# Patient Record
Sex: Male | Born: 1990 | Race: Black or African American | Hispanic: No | Marital: Single | Smoking: Never smoker
Health system: Southern US, Community
[De-identification: ages and names within clinical notes are randomized; demographics above are authoritative.]

---
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
## 2016-09-08 ENCOUNTER — Emergency Department (HOSPITAL_COMMUNITY): Payer: Self-pay

## 2016-09-08 ENCOUNTER — Emergency Department (HOSPITAL_COMMUNITY)
Admission: EM | Admit: 2016-09-08 | Discharge: 2016-09-08 | Disposition: A | Discharge status: Home / Self Care | Payer: Self-pay | Attending: Emergency Medicine | Admitting: Emergency Medicine

## 2016-09-08 ENCOUNTER — Encounter (HOSPITAL_COMMUNITY): Payer: Self-pay | Admitting: *Deleted

## 2016-09-08 DIAGNOSIS — R51 Headache: Secondary | ICD-10-CM | POA: Insufficient documentation

## 2016-09-08 DIAGNOSIS — R519 Headache, unspecified: Secondary | ICD-10-CM

## 2016-09-08 MED ORDER — KETOROLAC TROMETHAMINE 30 MG/ML IJ SOLN
30.0000 mg | Freq: Once | INTRAMUSCULAR | Status: AC
  Administered 2016-09-08: 30 mg via INTRAMUSCULAR
  Filled 2016-09-08: qty 1

## 2016-09-08 MED ORDER — METOCLOPRAMIDE HCL 10 MG PO TABS
5.0000 mg | ORAL_TABLET | Freq: Once | ORAL | Status: AC
  Administered 2016-09-08: 5 mg via ORAL
  Filled 2016-09-08: qty 1

## 2016-09-08 MED ORDER — DIPHENHYDRAMINE HCL 25 MG PO CAPS
25.0000 mg | ORAL_CAPSULE | Freq: Once | ORAL | Status: AC
  Administered 2016-09-08: 25 mg via ORAL
  Filled 2016-09-08: qty 1

## 2016-09-08 NOTE — ED Provider Notes (Signed)
[facility]-EMERGENCY DEPT Provider Note   CSN: [id number] Arrival date & time: 09/08/16  1254     History   Chief Complaint Chief Complaint  Patient presents with  . Headache    HPI Desman Betsey HolidayMathis is a 26 y.o. male.  HPI    26 year old male presents to his complaint of headache. Patient reports symptoms started 3 days ago. He reports pain and his eyes radiating to the back of his neck. He notes similar symptoms several years ago that required medications in the emergency room that resolved it. Patient notes that he took Excedrin yesterday, notes that this improved his headache, but it came back again. Patient denies any neurological deficits. He notes generalized neck and back aches. No recent infectious etiology, insect bites.   History reviewed. No pertinent past medical history.  There are no active problems to display for this patient.   History reviewed. No pertinent surgical history.     Home Medications    Prior to Admission medications   Not on File    Family History No family history on file.  Social History Social History  Substance Use Topics  . Smoking status: Never Smoker  . Smokeless tobacco: Never Used  . Alcohol use Not on file     Allergies   Patient has no known allergies.   Review of Systems Review of Systems  All other systems reviewed and are negative.  Physical Exam Updated Vital Signs BP 98/74 (BP Location: Right Arm)   Pulse 89   Temp 98.3 F (36.8 C) (Oral)   Resp 17   Ht 6' (1.829 m)   Wt 65.8 kg (145 lb)   SpO2 100%   BMI 19.67 kg/m   Physical Exam  Constitutional: He is oriented to person, place, and time. He appears well-developed and well-nourished.  HENT:  Head: Normocephalic and atraumatic.  Eyes: Pupils are equal, round, and reactive to light. Conjunctivae are normal. Right eye exhibits no discharge. Left eye exhibits no discharge. No scleral icterus.  Neck: Normal range of motion. No JVD present. No tracheal  deviation present.  Pulmonary/Chest: Effort normal. No stridor.  Neurological: He is alert and oriented to person, place, and time. He has normal strength. No cranial nerve deficit or sensory deficit. Coordination normal. GCS eye subscore is 4. GCS verbal subscore is 5. GCS motor subscore is 6.  No meningeal signs. No active range of motion of the neck  Psychiatric: He has a normal mood and affect. His behavior is normal. Judgment and thought content normal.  Nursing note and vitals reviewed.    ED Treatments / Results  Labs (all labs ordered are listed, but only abnormal results are displayed) Labs Reviewed - No data to display  EKG  EKG Interpretation None       Radiology Ct Head Wo Contrast  Result Date: 09/08/2016 CLINICAL DATA:  Headache with nausea and emesis for 2 days EXAM: CT HEAD WITHOUT CONTRAST TECHNIQUE: Contiguous axial images were obtained from the base of the skull through the vertex without intravenous contrast. COMPARISON:  None. FINDINGS: Brain: No evidence of acute infarction, hemorrhage, hydrocephalus, extra-axial collection or mass lesion/mass effect. Vascular: No hyperdense vessel or unexpected calcification. Skull: Normal. Negative for fracture or focal lesion. Sinuses/Orbits: Mild mucosal thickening in the maxillary and ethmoid sinuses. No acute orbital abnormality. Other: None IMPRESSION: No CT evidence for acute intracranial abnormality. Electronically Signed   By: Jasmine PangKim  Fujinaga M.D.   On: 09/08/2016 17:56    Procedures Procedures (including critical  care time)  Medications Ordered in ED Medications  ketorolac (TORADOL) 30 MG/ML injection 30 mg (30 mg Intramuscular Given 09/08/16 1519)  metoCLOPramide (REGLAN) tablet 5 mg (5 mg Oral Given 09/08/16 1520)  diphenhydrAMINE (BENADRYL) capsule 25 mg (25 mg Oral Given 09/08/16 1519)     Initial Impression / Assessment and Plan / ED Course  I have reviewed the triage vital signs and the nursing notes.  Pertinent  labs & imaging results that were available during my care of the patient were reviewed by me and considered in my medical decision making (see chart for details).     Final Clinical Impressions(s) / ED Diagnoses   Final diagnoses:  Acute nonintractable headache, unspecified headache type    Labs:   Imaging:CT head without contrast  Consults:  Therapeutics: Toradol, Reglan, Benadryl  Discharge Meds:   Assessment/Plan:   26 year old male presents today with headache. Significantly improved with the above medications. CT scan showing no acute findings. No signs of meningitis or any other significant life-threatening etiology on this patient. He is discharged home with symptomatic care instructions and strict return precautions. He verbalized understanding and agreement today's plan had no further questions or concerns at time of discharge.   New Prescriptions There are no discharge medications for this patient.    Eyvonne Mechanic, PA-C 09/08/16 2054    Lavera Guise, MD 09/09/16 [phone]

## 2016-09-08 NOTE — ED Triage Notes (Signed)
Pt states that he has had a headache for 2 days. Pt reports feeling chilled and nausea as well.

## 2016-09-08 NOTE — Discharge Instructions (Signed)
Please read attached information. If you experience any new or worsening signs or symptoms please return to the emergency room for evaluation. Please follow-up with your primary care provider or specialist as discussed. Please use medication prescribed only as directed and discontinue taking if you have any concerning signs or symptoms.   °

## 2016-09-08 NOTE — ED Notes (Signed)
Declined W/C at D/C and was escorted to lobby by RN. 

## 2018-10-30 ENCOUNTER — Emergency Department (HOSPITAL_COMMUNITY)
Admission: EM | Admit: 2018-10-30 | Discharge: 2018-10-30 | Disposition: A | Discharge status: Home / Self Care | Payer: Self-pay | Attending: Emergency Medicine | Admitting: Emergency Medicine

## 2018-10-30 ENCOUNTER — Other Ambulatory Visit: Payer: Self-pay

## 2018-10-30 ENCOUNTER — Encounter (HOSPITAL_COMMUNITY): Payer: Self-pay | Admitting: Emergency Medicine

## 2018-10-30 DIAGNOSIS — R519 Headache, unspecified: Secondary | ICD-10-CM | POA: Insufficient documentation

## 2018-10-30 DIAGNOSIS — H53149 Visual discomfort, unspecified: Secondary | ICD-10-CM | POA: Insufficient documentation

## 2018-10-30 DIAGNOSIS — R1110 Vomiting, unspecified: Secondary | ICD-10-CM | POA: Insufficient documentation

## 2018-10-30 MED ORDER — METOCLOPRAMIDE HCL 10 MG PO TABS
10.0000 mg | ORAL_TABLET | Freq: Once | ORAL | Status: AC
  Administered 2018-10-30: 10 mg via ORAL
  Filled 2018-10-30: qty 1

## 2018-10-30 MED ORDER — KETOROLAC TROMETHAMINE 30 MG/ML IJ SOLN
30.0000 mg | Freq: Once | INTRAMUSCULAR | Status: AC
  Administered 2018-10-30: 15:00:00 30 mg via INTRAMUSCULAR
  Filled 2018-10-30: qty 1

## 2018-10-30 MED ORDER — DIPHENHYDRAMINE HCL 25 MG PO CAPS
25.0000 mg | ORAL_CAPSULE | Freq: Once | ORAL | Status: AC
  Administered 2018-10-30: 15:00:00 25 mg via ORAL
  Filled 2018-10-30: qty 1

## 2018-10-30 NOTE — ED Provider Notes (Signed)
[facility]-EMERGENCY DEPT Provider Note   CSN: [id number] Arrival date & time: 10/30/18  1119     History   Chief Complaint Chief Complaint  Patient presents with  . Headache    HPI Kenneth Small is a 28 y.o. male.     28 y.o male with a PMH of headaches presents to the ED with a chief complaint of a headache x2 days.  Patient describes a dullness sensation to the top of his head with radiation throughout.  He does report similar episodes in the past of headaches which have been usually been resolved with medication in the ED.  He reports his headache is somewhat alleviated with Excedrin however returned today.  He does report one episode of nonbilious, nonbloody emesis prior to arrival which she believes he likely vomited his Excedrin.  He reports not being diagnosed with any prior history of migraines.  Does have some photophobia.  Denies any phonophobia, neck pain, no changes in vision or numbness.   The history is provided by the patient.     Home Medications    Prior to Admission medications   Not on File    Family History No family history on file.  Social History Social History   Tobacco Use  . Smoking status: Never Smoker  . Smokeless tobacco: Never Used  Substance Use Topics  . Alcohol use: Not on file  . Drug use: Not on file     Allergies   Patient has no known allergies.   Review of Systems Review of Systems  Constitutional: Negative for chills and fever.  HENT: Negative for ear pain and sore throat.   Eyes: Negative for pain and visual disturbance.  Respiratory: Negative for cough and shortness of breath.   Cardiovascular: Negative for chest pain and palpitations.  Gastrointestinal: Positive for vomiting. Negative for abdominal pain and nausea.  Genitourinary: Negative for dysuria and hematuria.  Musculoskeletal: Negative for arthralgias and back pain.  Skin: Negative for color change and rash.  Neurological: Positive  for headaches. Negative for dizziness, seizures, syncope, weakness, light-headedness and numbness.  All other systems reviewed and are negative.    Physical Exam Updated Vital Signs BP 122/60 (BP Location: Right Arm)   Pulse 83   Temp 98.2 F (36.8 C) (Oral)   Resp 16   SpO2 99%   Physical Exam Vitals signs and nursing note reviewed.  Constitutional:      Appearance: He is well-developed.  HENT:     Head: Normocephalic and atraumatic.  Eyes:     General: No scleral icterus.    Pupils: Pupils are equal, round, and reactive to light.  Neck:     Musculoskeletal: Normal range of motion.  Cardiovascular:     Heart sounds: Normal heart sounds.  Pulmonary:     Effort: Pulmonary effort is normal.     Breath sounds: Normal breath sounds. No wheezing.  Chest:     Chest wall: No tenderness.  Abdominal:     General: Bowel sounds are normal. There is no distension.     Palpations: Abdomen is soft.     Tenderness: There is no abdominal tenderness.  Musculoskeletal:        General: No tenderness or deformity.  Skin:    General: Skin is warm and dry.  Neurological:     Mental Status: He is alert and oriented to person, place, and time.     Comments: Alert, oriented, thought content appropriate. Speech fluent without evidence of  aphasia. Able to follow 2 step commands without difficulty.  Cranial Nerves:  II:  Peripheral visual fields grossly normal, pupils, round, reactive to light III,IV, VI: ptosis not present, extra-ocular motions intact bilaterally  V,VII: smile symmetric, facial light touch sensation equal VIII: hearing grossly normal bilaterally  IX,X: midline uvula rise  XI: bilateral shoulder shrug equal and strong XII: midline tongue extension  Motor:  5/5 in upper and lower extremities bilaterally including strong and equal grip strength and dorsiflexion/plantar flexion Sensory: light touch normal in all extremities.  Cerebellar: normal finger-to-nose with bilateral  upper extremities, pronator drift negative Gait: normal gait and balance       ED Treatments / Results  Labs (all labs ordered are listed, but only abnormal results are displayed) Labs Reviewed - No data to display  EKG None  Radiology No results found.  Procedures Procedures (including critical care time)  Medications Ordered in ED Medications  metoCLOPramide (REGLAN) tablet 10 mg (10 mg Oral Given 10/30/18 1434)  diphenhydrAMINE (BENADRYL) capsule 25 mg (25 mg Oral Given 10/30/18 1433)  ketorolac (TORADOL) 30 MG/ML injection 30 mg (30 mg Intramuscular Given 10/30/18 1434)     Initial Impression / Assessment and Plan / ED Course  I have reviewed the triage vital signs and the nursing notes.  Pertinent labs & imaging results that were available during my care of the patient were reviewed by me and considered in my medical decision making (see chart for details).       Patient with a past medical history of headaches sent to the ED with complaint of headache for the past 4 days.  Reports this is similar in nature to his previous episodes.  He denies any trauma, changes in vision, weakness.  Did have one episode of vomiting prior to arrival.  Has been taking Excedrin with some improvement in symptoms however headache seems to return.  He does report alcohol use, last use on Sunday.  Patient has not been placed on any prophylactic medicine for migraines or diagnosed with migraine via physician.  During my evaluation patient appears uncomfortable, there is photophobia on my exam.  Pupils are equal and reactive.  Neuro exam is unremarkable.  Patient is ambulatory with a steady gait wearing his sunglasses.  We will provide him with pain medication to further manage his symptoms.  According to his records which have been personally reviewed, a CT imaging was performed 2 years ago which did not show any aneurysms, acute pathology then.  Seeing as patient reports is a similar episode we  will attempt medication prior to reassessment and disposition.   4:37 PM patient was reassessed by me.  Vital signs are within normal limits.  He is eating and drinking appropriately.  Reports his headache has resolved after headache cocktail.  No further emesis episodes.  Otherwise is well-appearing.  We will provide him with referral for neurology to help further manage his bad headaches.  Patient understands and agrees with management, return precautions provided at length.   Portions of this note were generated with Lobbyist. Dictation errors may occur despite best attempts at proofreading.  Final Clinical Impressions(s) / ED Diagnoses   Final diagnoses:  Bad headache    ED Discharge Orders    None       Janeece Fitting, PA-C 10/30/18 1638    Lucrezia Starch, MD 10/31/18 1015

## 2018-10-30 NOTE — Discharge Instructions (Addendum)
I have provided a referral to neurology. Please schedule an appointment to obtain further management of your recurrent headaches.   If you experience any dizziness, blurry vision or worsening symptoms return to the ED.

## 2018-10-30 NOTE — ED Triage Notes (Signed)
Pt having constant headache since Sunday. Reports will have these yearly. Taken Excedrin without relief.

## 2019-01-24 ENCOUNTER — Ambulatory Visit (HOSPITAL_COMMUNITY)
Admission: EM | Admit: 2019-01-24 | Discharge: 2019-01-24 | Disposition: A | Discharge status: Home / Self Care | Payer: Self-pay | Attending: Family Medicine | Admitting: Family Medicine

## 2019-01-24 ENCOUNTER — Other Ambulatory Visit: Payer: Self-pay

## 2019-01-24 ENCOUNTER — Encounter (HOSPITAL_COMMUNITY): Payer: Self-pay

## 2019-01-24 DIAGNOSIS — L42 Pityriasis rosea: Secondary | ICD-10-CM

## 2019-01-24 NOTE — ED Triage Notes (Signed)
Pt is here with a rash that developed after he changed soap(s), the rash is on his all over his body started a week ago.

## 2019-01-24 NOTE — ED Provider Notes (Signed)
[facility]   [id number] 01/24/19 Arrival Time: 1416  ASSESSMENT & PLAN:  1. Pityriasis rosea     See AVS for d/c information. No signs of allergic reaction. May f/u here as needed.  Reviewed expectations re: course of current medical issues. Questions answered. Outlined signs and symptoms indicating need for more acute intervention. Patient verbalized understanding. After Visit Summary given.   SUBJECTIVE:  Kenneth Small is a 29 y.o. male who presents with a skin complaint.   Location: torso/back Onset: gradual Duration: one week Associated pruritis? minimal Associated pain? none Progression: increasing steadily  Drainage? none Known trigger? No  New soaps/lotions/topicals/detergents? Questions relation to new soap. Environmental exposures? No  Contacts with similar? No  Recent travel? No  Other associated symptoms: none Therapies tried thus far: none Arthralgia or myalgia? none Recent illness? none Fever? none New medications? none No specific aggravating or alleviating factors reported.  ROS: As per HPI.  OBJECTIVE: Vitals:   01/24/19 1507 01/24/19 1511  BP:  109/69  Pulse:  65  Resp:  16  Temp:  98.5 F (36.9 C)  TempSrc:  Oral  SpO2:  100%  Weight: 69.7 kg     General appearance: alert; no distress HEENT: [place]; AT Neck: supple with FROM Lungs: clear to auscultation bilaterally Heart: regular rate and rhythm Extremities: no edema; moves all extremities normally Skin: warm and dry; crops of oval, sharply delimited papules on trunk oriented along skin lines; none on the scalp and face. Psychological: alert and cooperative; normal mood and affect  No Known Allergies  History reviewed. No pertinent past medical history. Social History   Socioeconomic History  . Marital status: Single    Spouse name: Not on file  . Number of children: Not on file  . Years of education: Not on file  . Highest education level: Not on file    Occupational History  . Not on file  Tobacco Use  . Smoking status: Never Smoker  . Smokeless tobacco: Never Used  Substance and Sexual Activity  . Alcohol use: Yes  . Drug use: Yes    Types: Marijuana  . Sexual activity: Yes    Birth control/protection: None  Other Topics Concern  . Not on file  Social History Narrative  . Not on file   Social Determinants of Health   Financial Resource Strain:   . Difficulty of Paying Living Expenses: Not on file  Food Insecurity:   . Worried About Programme researcher, broadcasting/film/video in the Last Year: Not on file  . Ran Out of Food in the Last Year: Not on file  Transportation Needs:   . Lack of Transportation (Medical): Not on file  . Lack of Transportation (Non-Medical): Not on file  Physical Activity:   . Days of Exercise per Week: Not on file  . Minutes of Exercise per Session: Not on file  Stress:   . Feeling of Stress : Not on file  Social Connections:   . Frequency of Communication with Friends and Family: Not on file  . Frequency of Social Gatherings with Friends and Family: Not on file  . Attends Religious Services: Not on file  . Active Member of Clubs or Organizations: Not on file  . Attends Banker Meetings: Not on file  . Marital Status: Not on file  Intimate Partner Violence:   . Fear of Current or Ex-Partner: Not on file  . Emotionally Abused: Not on file  . Physically Abused: Not on file  .  Sexually Abused: Not on file   Family History  Problem Relation Age of Onset  . Thyroid disease Mother   . Healthy Father    History reviewed. No pertinent surgical history.   Vanessa Kick, MD 01/24/19 [phone]

## 2020-12-18 ENCOUNTER — Emergency Department (HOSPITAL_COMMUNITY): Payer: Self-pay

## 2020-12-18 ENCOUNTER — Emergency Department (HOSPITAL_COMMUNITY)
Admission: EM | Admit: 2020-12-18 | Discharge: 2020-12-18 | Disposition: A | Discharge status: Home / Self Care | Payer: Self-pay | Attending: Emergency Medicine | Admitting: Emergency Medicine

## 2020-12-18 ENCOUNTER — Encounter (HOSPITAL_COMMUNITY): Payer: Self-pay | Admitting: *Deleted

## 2020-12-18 DIAGNOSIS — M5116 Intervertebral disc disorders with radiculopathy, lumbar region: Secondary | ICD-10-CM | POA: Insufficient documentation

## 2020-12-18 DIAGNOSIS — M5126 Other intervertebral disc displacement, lumbar region: Secondary | ICD-10-CM

## 2020-12-18 DIAGNOSIS — M5416 Radiculopathy, lumbar region: Secondary | ICD-10-CM

## 2020-12-18 MED ORDER — HYDROCODONE-ACETAMINOPHEN 5-325 MG PO TABS: 1.0000 | ORAL_TABLET | Freq: Four times a day (QID) | ORAL | 0 refills | Status: AC | PRN

## 2020-12-18 MED ORDER — PREDNISONE 10 MG (21) PO TBPK: ORAL_TABLET | Freq: Every day | ORAL | 0 refills | Status: DC

## 2020-12-18 MED ORDER — PREDNISONE 50 MG PO TABS: 60.0000 mg | ORAL_TABLET | Freq: Once | ORAL | Status: DC

## 2020-12-18 NOTE — Discharge Instructions (Signed)
Take the entire course of the prednisone prescribed.  You may also take the hydrocodone if needed for increased pain, do not drive within 4 hours of taking this medication however as it will make you drowsy.  You may use a heating pad applied to your lower back for 20 minutes several times daily.  Call Dr. Danielle Dess as discussed for follow-up care.  You have also been referred for physical therapy which may help you with your low back pain.  Avoid any activity that makes your symptoms worse.  You have also been given some work restrictions as discussed, refer to the work note attached.

## 2020-12-18 NOTE — ED Triage Notes (Signed)
Pain in back radiating into right thigh, injured 2 months ago at a bowling tournament

## 2020-12-18 NOTE — ED Provider Notes (Signed)
[facility] EMERGENCY DEPARTMENT Provider Note   CSN: [id number] Arrival date & time: 12/18/20  1039     History Chief Complaint  Patient presents with   Back Pain    Kenneth Small is a 30 y.o. male presenting for evaluation of low back pain which radiates into his right thigh and intermittently to his calf and is associated with intermittent pins and needle sensation in the right foot.  He describes a low back injury when he was bowling about 2 months ago, he used rest at which time his pain completely resolved.  However over the past 3 weeks he has had a return of pain which started in his lower back and has now migrated and localizes in his right posterior buttock region.  He is working for [employer] as a Librarian, academic person and states he has been able to tolerate his work days, although with increasing pain as described.  He gets pins-and-needles sensation in the right foot which seems to be triggered by sitting, especially at the end of his workday.  He denies urinary incontinence or retention but has noticed an urgency to his need to empty his bladder.  He denies painful urination.  He has had no fevers or chills, he denies numbness in the perirectal or perineal region.  He has taken Bayer back and body and is also tried Aleve which does give temporary relief of his symptoms.  The history is provided by the patient.      History reviewed. No pertinent past medical history.  There are no problems to display for this patient.   History reviewed. No pertinent surgical history.     Family History  Problem Relation Age of Onset   Thyroid disease Mother    Healthy Father     Social History   Tobacco Use   Smoking status: Never   Smokeless tobacco: Never  Substance Use Topics   Alcohol use: Yes   Drug use: Yes    Types: Marijuana    Home Medications Prior to Admission medications   Medication Sig Start Date End Date Taking? Authorizing Provider   HYDROcodone-acetaminophen (NORCO/VICODIN) 5-325 MG tablet Take 1 tablet by mouth every 6 (six) hours as needed for severe pain. 12/18/20  Yes Deklynn Charlet, Raynelle Fanning, PA-C  predniSONE (STERAPRED UNI-PAK 21 TAB) 10 MG (21) TBPK tablet Take by mouth daily. Take 6 tabs by mouth daily  for 2 days, then 5 tabs for 2 days, then 4 tabs for 2 days, then 3 tabs for 2 days, 2 tabs for 2 days, then 1 tab by mouth daily for 2 days 12/18/20  Yes Lenell Mcconnell, Raynelle Fanning, PA-C    Allergies    Patient has no known allergies.  Review of Systems   Review of Systems  Constitutional:  Negative for fever.  Respiratory:  Negative for shortness of breath.   Cardiovascular:  Negative for chest pain and leg swelling.  Gastrointestinal:  Negative for abdominal distention, abdominal pain and constipation.  Genitourinary:  Negative for difficulty urinating, dysuria, flank pain, frequency and urgency.  Musculoskeletal:  Positive for back pain. Negative for gait problem and joint swelling.  Skin:  Negative for rash.  Neurological:  Negative for weakness and numbness.  All other systems reviewed and are negative.  Physical Exam Updated Vital Signs BP 103/74 (BP Location: Right Arm)    Pulse 60    Temp 98 F (36.7 C) (Oral)    Resp 16    SpO2 100%   Physical Exam Vitals and  nursing note reviewed.  Constitutional:      Appearance: He is well-developed.  HENT:     Head: Normocephalic.  Eyes:     Conjunctiva/sclera: Conjunctivae normal.  Cardiovascular:     Rate and Rhythm: Normal rate.     Pulses: Normal pulses.     Comments: Pedal pulses normal. Pulmonary:     Effort: Pulmonary effort is normal.  Abdominal:     General: Bowel sounds are normal. There is no distension.     Palpations: Abdomen is soft. There is no mass.  Musculoskeletal:        General: Normal range of motion.     Cervical back: Normal range of motion and neck supple.     Lumbar back: Tenderness present. No swelling, edema or spasms.       Legs:     Comments:  No midline lumbar sacral pain or deformity.  He does have some tenderness in his right sciatic notch region and his right lateral hip.  Positive straight leg raise left.  Skin:    General: Skin is warm and dry.  Neurological:     General: No focal deficit present.     Mental Status: He is alert.     Sensory: No sensory deficit.     Motor: No tremor or atrophy.     Gait: Gait normal.     Deep Tendon Reflexes:     Reflex Scores:      Patellar reflexes are 2+ on the right side and 2+ on the left side.    Comments: No strength deficit noted in hip and knee flexor and extensor muscle groups.  Ankle flexion and extension intact.    ED Results / Procedures / Treatments   Labs (all labs ordered are listed, but only abnormal results are displayed) Labs Reviewed - No data to display  EKG None  Radiology MR LUMBAR SPINE WO CONTRAST  Result Date: 12/18/2020 CLINICAL DATA:  Low back pain radiating to right thigh EXAM: MRI LUMBAR SPINE WITHOUT CONTRAST TECHNIQUE: Multiplanar, multisequence MR imaging of the lumbar spine was performed. No intravenous contrast was administered. COMPARISON:  None. FINDINGS: Segmentation: Standard; the lowest formed disc space is designated L5-S1 Alignment:  Normal. Vertebrae: Vertebral body heights are preserved. Marrow signal is normal. Conus medullaris and cauda equina: Conus extends to the L1-L2 level. Conus and cauda equina appear normal. Paraspinal and other soft tissues: Unremarkable. Disc levels: There is disc desiccation and narrowing at L5-S1. There is a prominent right subarticular zone protrusion which effaces the subarticular zone and displaces and likely impinges the traversing right S1 nerve root. There is no significant spinal canal or neural foraminal stenosis at this level. The disc spaces are otherwise preserved. There is no significant spinal canal or neural foraminal stenosis at the other levels. IMPRESSION: Right subarticular zone disc protrusion at  L5-S1 which displaces and likely impinges the traversing S1 nerve root, likely accounting for the patient's symptoms. Electronically Signed   By: Lesia Hausen M.D.   On: 12/18/2020 13:04    Procedures Procedures   Medications Ordered in ED Medications - No data to display  ED Course  I have reviewed the triage vital signs and the nursing notes.  Pertinent labs & imaging results that were available during my care of the patient were reviewed by me and considered in my medical decision making (see chart for details).    MDM Rules/Calculators/A&P  Patient's MRI reviewed and discussed with patient.  Also discussed with Dr. Danielle Dess of neurosurgery who recommends a 2-week prednisone taper which was started.  We also discussed other home treatments including activity as tolerated, heat therapy, avoiding lifting greater than 15 pounds and bending and squatting more than a third of his workday.  Dr. Danielle Dess has also suggested outpatient physical therapy while he is waiting for follow-up with him, he is to call for an appointment within the next 2 weeks.  Patient is agreeable to this plan.  Is also given a few hydrocodone tablets, caution regarding sedation.  Return precautions were also outlined.    Final Clinical Impression(s) / ED Diagnoses Final diagnoses:  Herniated lumbar intervertebral disc  Lumbar radiculopathy    Rx / DC Orders ED Discharge Orders          Ordered    predniSONE (STERAPRED UNI-PAK 21 TAB) 10 MG (21) TBPK tablet  Daily        12/18/20 1615    HYDROcodone-acetaminophen (NORCO/VICODIN) 5-325 MG tablet  Every 6 hours PRN        12/18/20 1615    PT eval and treat        12/18/20 1623             Burgess Amor, PA-C 12/18/20 1713    Vanetta Mulders, MD 12/23/20 1315

## 2021-01-03 ENCOUNTER — Emergency Department (HOSPITAL_COMMUNITY)
Admission: EM | Admit: 2021-01-03 | Discharge: 2021-01-03 | Disposition: A | Discharge status: Home / Self Care | Payer: Self-pay | Attending: Emergency Medicine | Admitting: Emergency Medicine

## 2021-01-03 ENCOUNTER — Other Ambulatory Visit: Payer: Self-pay

## 2021-01-03 DIAGNOSIS — R319 Hematuria, unspecified: Secondary | ICD-10-CM | POA: Insufficient documentation

## 2021-01-03 DIAGNOSIS — M545 Low back pain, unspecified: Secondary | ICD-10-CM | POA: Insufficient documentation

## 2021-01-03 DIAGNOSIS — R000 Tachycardia, unspecified: Secondary | ICD-10-CM | POA: Insufficient documentation

## 2021-01-03 MED ORDER — HYDROMORPHONE HCL 1 MG/ML IJ SOLN
0.5000 mg | Freq: Once | INTRAMUSCULAR | Status: AC
  Administered 2021-01-03: 0.5 mg via INTRAMUSCULAR
  Filled 2021-01-03: qty 1

## 2021-01-03 MED ORDER — OXYCODONE-ACETAMINOPHEN 5-325 MG PO TABS: 1.0000 | ORAL_TABLET | Freq: Three times a day (TID) | ORAL | 0 refills | Status: AC | PRN

## 2021-01-03 MED ORDER — PREDNISONE 10 MG PO TABS: 30.0000 mg | ORAL_TABLET | Freq: Every day | ORAL | 0 refills | Status: AC

## 2021-01-03 NOTE — Discharge Instructions (Signed)
You have been seen here for back painI recommend taking over-the-counter pain medications like ibuprofen and/or Tylenol every 6 as needed.  Please follow dosage and on the back of bottle.  I also recommend applying heat to the area and stretching out the muscles as this will help decrease stiffness and pain.  I have given you information on exercises please follow.   I have given you a short course of narcotics please take as prescribed.  This medication can make you drowsy do not consume alcohol or operate heavy machinery when taking this medication.  This medication is Tylenol so be careful when taking Tylenol so that you do not take too much.  Have also given you some steroids please take as prescribed.  Follow-up with neurosurgery for further evaluation.  Come back to the emergency department if you develop chest pain, shortness of breath, severe abdominal pain, uncontrolled nausea, vomiting, diarrhea.

## 2021-01-03 NOTE — ED Provider Notes (Signed)
[facility] EMERGENCY DEPARTMENT Provider Note   CSN: [id number] Arrival date & time: 01/03/21  1613     History  Chief Complaint  Patient presents with   Hip Pain    Kenneth Small is a 31 y.o. male.  HPI  Without significant medical history presents with complaints of right lower back and hip pain.  Patient states pain has been going on for last few weeks, states the pain remains in his right lower back will radiate down his right leg denies paresthesias or weakness in lower extremities, denies saddle paresthesias, urinary incontinency, retention, difficult bowel movements, denies any flank tenderness, stomach pain, nausea, vomit, diarrhea, denies fevers, chills, chest pain, peripheral edema.  Denies traumatic injury associated this pain, no history of IV drug use.  Patient states he has been taking steroids and narcotic medication not much relief.  He was seen in the emergency department 2 weeks ago and was diagnosed with a bulging disc.  He has no other complaints.  Pain is worsened with movement improved with rest.  At the reviewing patient's charts patient was seen here about 2 weeks ago MRI of lumbar spine reveals subarticular zone disc protrusion L5-S1.  Home Medications Prior to Admission medications   Medication Sig Start Date End Date Taking? Authorizing Provider  oxyCODONE-acetaminophen (PERCOCET/ROXICET) 5-325 MG tablet Take 1 tablet by mouth every 8 (eight) hours as needed for up to 3 days for severe pain. 01/03/21 01/06/21 Yes Marcello Fennel, PA-C  predniSONE (DELTASONE) 10 MG tablet Take 3 tablets (30 mg total) by mouth daily for 5 days. 01/03/21 01/08/21 Yes Marcello Fennel, PA-C  HYDROcodone-acetaminophen (NORCO/VICODIN) 5-325 MG tablet Take 1 tablet by mouth every 6 (six) hours as needed for severe pain. 12/18/20   Evalee Jefferson, PA-C      Allergies    Patient has no known allergies.    Review of Systems   Review of Systems  Constitutional:  Negative for chills  and fever.  HENT:  Negative for congestion.   Respiratory:  Negative for shortness of breath.   Cardiovascular:  Negative for chest pain.  Gastrointestinal:  Negative for abdominal pain.  Genitourinary:  Negative for flank pain.  Musculoskeletal:  Positive for back pain.  Skin:  Negative for rash.  Neurological:  Negative for dizziness.  Hematological:  Does not bruise/bleed easily.   Physical Exam Updated Vital Signs BP 140/85 (BP Location: Left Arm)    Pulse (!) 104    Temp 98.9 F (37.2 C) (Oral)    Resp 15    Ht 6' (1.829 m)    Wt 68 kg    SpO2 100%    BMI 20.34 kg/m  Physical Exam Vitals and nursing note reviewed.  Constitutional:      General: He is not in acute distress.    Appearance: He is not ill-appearing.  HENT:     Head: Normocephalic and atraumatic.     Nose: No congestion.  Eyes:     Conjunctiva/sclera: Conjunctivae normal.  Cardiovascular:     Rate and Rhythm: Regular rhythm. Tachycardia present.     Pulses: Normal pulses.  Pulmonary:     Effort: Pulmonary effort is normal.  Musculoskeletal:     Comments: Patient noted pain with hematuria starting his right iliac crest, he also has pain around his hip, there is no hip instability, no leg shortening, he has full range of motion his toes ankle knee and hip, 2+ patellar reflexes, neurovascular fully intact.  Skin:  General: Skin is warm and dry.  Neurological:     Mental Status: He is alert.  Psychiatric:        Mood and Affect: Mood normal.    ED Results / Procedures / Treatments   Labs (all labs ordered are listed, but only abnormal results are displayed) Labs Reviewed - No data to display  EKG None  Radiology No results found.  Procedures Procedures    Medications Ordered in ED Medications  HYDROmorphone (DILAUDID) injection 0.5 mg (0.5 mg Intramuscular Given 01/03/21 1806)    ED Course/ Medical Decision Making/ A&P                           Medical Decision Making  This patient  presents to the ED for concern of right lower back pain, this involves an extensive number of treatment options, and is a complaint that carries with it a high risk of complications and morbidity.  The differential diagnosis includes spinal cord abnormality, septic arthritis, spinal fracture    Additional history obtained:  Additional history obtained from electronic medical records External records from outside source obtained and reviewed including previous ED chart Evalee Jefferson, PA-C   Co morbidities that complicate the patient evaluation  N/A  Social Determinants of Health:  N/A    Lab Tests:  I Ordered, and personally interpreted labs.  The pertinent results include: N/A   Imaging Studies ordered:  N/A   Cardiac Monitoring:  N/A   Medicines ordered and prescription drug management:  I ordered medication including Dilaudid for pain Reevaluation of the patient after these medicines showed that the patient improved I have reviewed the patients home medicines and have made adjustments as needed    Reevaluation:  After the interventions noted above, I reevaluated the patient and found that they have :improved  Reassessed has no complaints this time, agreeable for discharge.    Test Considered:  Consider imaging of the back but will defer is no red flag symptoms I have low suspicion for spinal cord abnormality and/or spinal fracture at this time.    Rule out I have low suspicion for spinal fracture or spinal cord abnormality as patient denies urinary incontinency, retention, difficulty with bowel movements, denies saddle paresthesias.  Spine was palpated there is no step-off, crepitus or gross deformities felt, patient had  5/5 strength, full range of motion, neurovascular fully intact in the lower extremities . Low suspicion for septic arthritis as patient denies IV drug use, skin exam was performed no erythematous, edema or warm joints noted.  Low suspicion  for AAA or dissection as patient has atypical etiology more consistent with bulging disc.  Low suspicion for UTI, Pilo, kidney stone he denies any urinary symptoms, has no flank tenderness.     Dispostion:  After consideration of the diagnostic results and the patients response to treatment, I feel that the patent would benefit from short course of narcotic medication, over-the-counter pain medications, following up with neurosurgery for further evaluation.  Gave strict return precautions..   Problem List / ED Course:  Right lower back pain          Final Clinical Impression(s) / ED Diagnoses Final diagnoses:  Acute right-sided low back pain without sciatica    Rx / DC Orders ED Discharge Orders          Ordered    oxyCODONE-acetaminophen (PERCOCET/ROXICET) 5-325 MG tablet  Every 8 hours PRN  01/03/21 1827    predniSONE (DELTASONE) 10 MG tablet  Daily        01/03/21 1827              Aron Baba 01/03/21 Loman Chroman, MD 01/07/21 1450

## 2021-01-03 NOTE — ED Notes (Signed)
Pt d/c home with fiance per MD order. Discharge summary reviewed, verbalize understanding . Ambulatory off unit. No s/s of acute distress noted. Reports fiance is discharge ride home.

## 2021-01-03 NOTE — ED Triage Notes (Signed)
Pt arrives with c/o right hip/buttocks pain that radiates down his leg. Pt was seen about 2 weeks ago and was told he had some "bulging disk" that was causing his pain. Per pt, the pain became unbearable on Friday. Pt denies urinary/bowel incontinence.

## 2021-01-04 ENCOUNTER — Telehealth (HOSPITAL_COMMUNITY): Payer: Self-pay | Admitting: Emergency Medicine

## 2021-01-04 MED ORDER — OXYCODONE-ACETAMINOPHEN 5-325 MG PO TABS: 1.0000 | ORAL_TABLET | Freq: Four times a day (QID) | ORAL | 0 refills | Status: AC | PRN

## 2021-01-04 MED ORDER — PREDNISONE 10 MG PO TABS: 30.0000 mg | ORAL_TABLET | Freq: Every day | ORAL | 0 refills | Status: AC

## 2021-01-04 NOTE — Telephone Encounter (Signed)
Medications sent to correct pharmacy 

## 2022-04-03 ENCOUNTER — Emergency Department (HOSPITAL_COMMUNITY)
Admission: EM | Admit: 2022-04-03 | Discharge: 2022-04-03 | Disposition: A | Discharge status: Home / Self Care | Payer: No Typology Code available for payment source | Attending: Emergency Medicine | Admitting: Emergency Medicine

## 2022-04-03 ENCOUNTER — Other Ambulatory Visit: Payer: Self-pay

## 2022-04-03 ENCOUNTER — Encounter (HOSPITAL_COMMUNITY): Payer: Self-pay | Admitting: *Deleted

## 2022-04-03 DIAGNOSIS — M545 Low back pain, unspecified: Secondary | ICD-10-CM | POA: Insufficient documentation

## 2022-04-03 DIAGNOSIS — X500XXA Overexertion from strenuous movement or load, initial encounter: Secondary | ICD-10-CM | POA: Insufficient documentation

## 2022-04-03 MED ORDER — CYCLOBENZAPRINE HCL 10 MG PO TABS: 10.0000 mg | ORAL_TABLET | Freq: Two times a day (BID) | ORAL | 0 refills | Status: AC | PRN

## 2022-04-03 MED ORDER — CYCLOBENZAPRINE HCL 10 MG PO TABS: 10.0000 mg | ORAL_TABLET | Freq: Two times a day (BID) | ORAL | 0 refills | Status: DC | PRN

## 2022-04-03 NOTE — ED Triage Notes (Signed)
Pt with lower back pain that started this morning, seen for same.

## 2022-04-03 NOTE — ED Provider Notes (Signed)
[facility] Provider Note   CSN: [id number] Arrival date & time: 04/03/22  1538     History  Chief Complaint  Patient presents with   Back Pain   HPI Kenneth Small is a 32 y.o. male with known herniated lumbar intervertebral disc with associated radiculopathy presenting for back pain. States yesterday he was moving a lot of boxes around the house and mattresses and started to have lower back pain that started all of a sudden. Lower back pain is all across the lower back. Non radiating.  States the pain is very similar to the pain has had before associated with his "slipped disc".  States he has been seen by neurosurgery who has advised for surgical intervention.  Patient states that he "does not have the financial means" to afford surgery. He took some meloxicam this morning which she had leftover from a few months ago.  States that did improve his symptoms.  Denies saddle anesthesia, urinary or bowel incontinence, and fever.    Back Pain     Home Medications Prior to Admission medications   Medication Sig Start Date End Date Taking? Authorizing Provider  cyclobenzaprine (FLEXERIL) 10 MG tablet Take 1 tablet (10 mg total) by mouth 2 (two) times daily as needed for muscle spasms. 04/03/22  Yes Harriet Pho, PA-C  HYDROcodone-acetaminophen (NORCO/VICODIN) 5-325 MG tablet Take 1 tablet by mouth every 6 (six) hours as needed for severe pain. 12/18/20   Evalee Jefferson, PA-C  oxyCODONE-acetaminophen (PERCOCET/ROXICET) 5-325 MG tablet Take 1 tablet by mouth every 6 (six) hours as needed for severe pain. [id number]   Lianne Cure, DO      Allergies    Patient has no known allergies.    Review of Systems   Review of Systems  Musculoskeletal:  Positive for back pain.    Physical Exam Updated Vital Signs BP 114/68 (BP Location: Right Arm)   Pulse 74   Temp 97.8 F (36.6 C) (Oral)   Resp 16   SpO2 100%  Physical Exam Constitutional:       Appearance: Normal appearance.  HENT:     Head: Normocephalic.     Nose: Nose normal.  Eyes:     Conjunctiva/sclera: Conjunctivae normal.  Pulmonary:     Effort: Pulmonary effort is normal.  Musculoskeletal:     Comments: TTP about the paraspinal and midline of the lumbar spine  Neurological:     Mental Status: He is alert.  Psychiatric:        Mood and Affect: Mood normal.     ED Results / Procedures / Treatments   Labs (all labs ordered are listed, but only abnormal results are displayed) Labs Reviewed - No data to display   EKG None  Radiology No results found.  Procedures Procedures    Medications Ordered in ED Medications - No data to display  ED Course/ Medical Decision Making/ A&P                             Medical Decision Making  32 year old male presenting for lower back pain.  Exam notable for generalized lower back tenderness.  Patient has known herniated lumbar intervertebral disc.  Patient also states that his pain today is similar to the pain has had in the past.  Sent Flexeril to his pharmacy.  Advised to continue take meloxicam.  Also advised to follow-up with his PCP for his chronic  back pain.  Considered cauda equina syndrome but unlikely given no red flag symptoms.  Symptoms are consistent with spinal abscess.  Discussed return precautions.        Final Clinical Impression(s) / ED Diagnoses Final diagnoses:  Low back pain, unspecified back pain laterality, unspecified chronicity, unspecified whether sciatica present    Rx / DC Orders ED Discharge Orders          Ordered    cyclobenzaprine (FLEXERIL) 10 MG tablet  2 times daily PRN        04/03/22 1738              Harriet Pho, PA-C 04/03/22 1739    Sherwood Gambler, MD 04/03/22 2314

## 2022-04-03 NOTE — Discharge Instructions (Addendum)
Evaluation today is overall reassuring for your lower back.  It is likely related to your herniated lumbar disc.  I do recommend that if possible you do seek to schedule that surgery if possible.  Otherwise recommend you follow-up with your PCP for ongoing management and evaluation of your lower back pain.  I have sent Flexeril which is a muscle relaxer to your pharmacy.  If you have saddle numbness, urinary or bowel incontinence, new fever or new trauma to your back or any other concerning symptom please return emergency department further evaluation.

## 2022-07-23 IMAGING — MR MR LUMBAR SPINE W/O CM
5 series · 31 of 48 positions shown · non-contrast
Comparison: None.

CLINICAL DATA: Low back pain radiating to right thigh

EXAM:
MRI LUMBAR SPINE WITHOUT CONTRAST
TECHNIQUE: Multiplanar, multisequence MR imaging of the lumbar spine was
performed. No intravenous contrast was administered.

[Series 5: T2 · sagittal · 4.0mm · 0.84mm/px · 6 of 15 slices shown (1 of 2)]
[im 1/15]
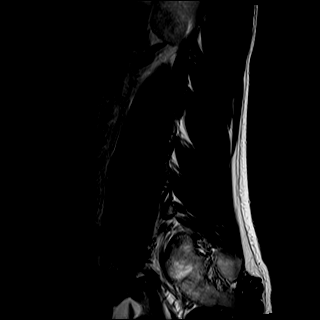
[im 3/15]
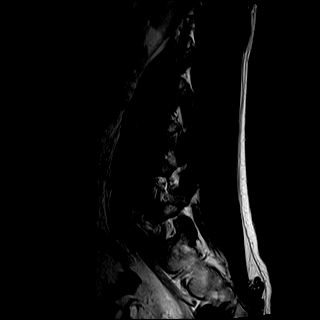
[im 6/15]
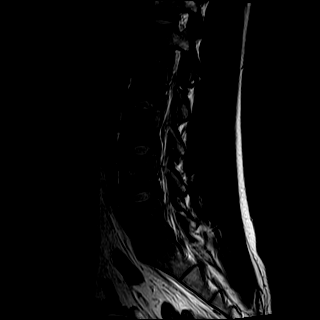
[im 9/15]
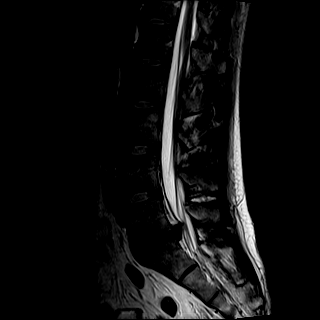
[im 12/15]
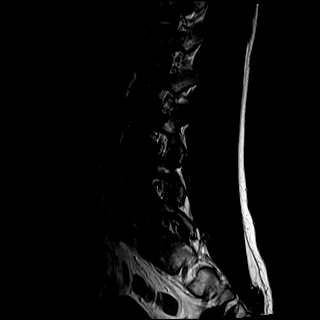
[im 15/15]
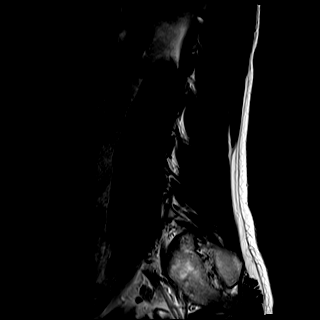

[Series 6: T1 · sagittal · 4.0mm · 0.84mm/px · 6 of 15 slices shown (1 of 2)]
[im 1/15]
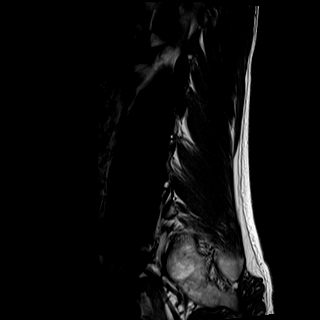
[im 3/15]
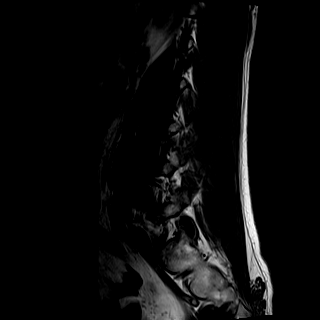
[im 6/15]
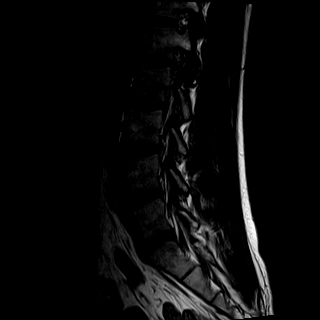
[im 9/15]
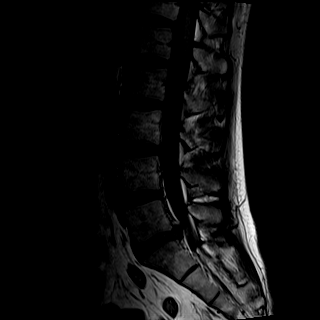
[im 12/15]
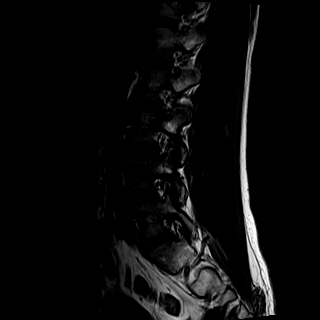
[im 15/15]
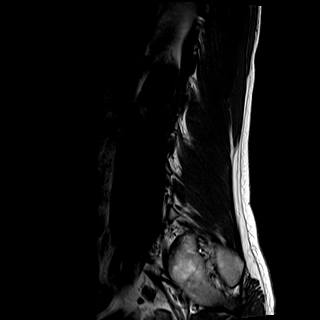

[Series 7: STIR · sagittal · 4.0mm · 0.42mm/px · 1 of 15 slices shown]
[im 1/15]
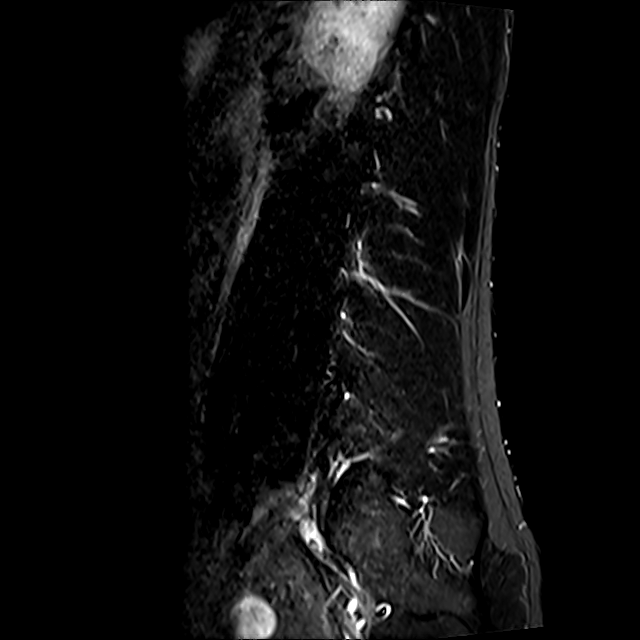

[Series 8: T2 · axial · 4.0mm · 0.74mm/px · z∈[-147,+58]mm · 9 of 36 slices shown (2 of 2)]
[im 1/36]
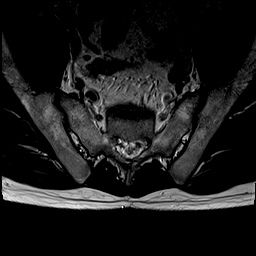
[im 6/36]
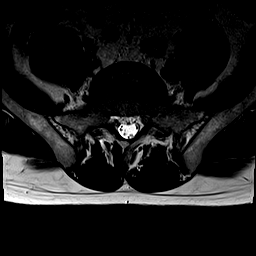
[im 11/36]
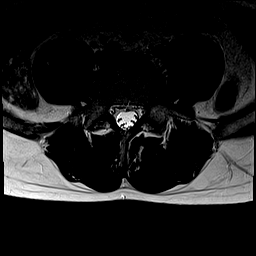
[im 16/36]
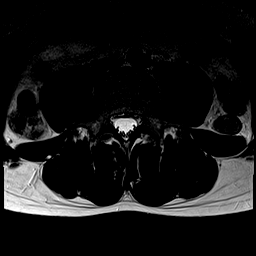
[im 18/36]
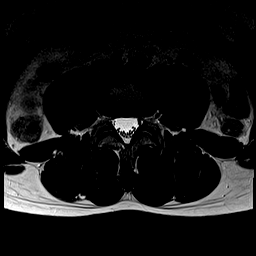
[im 21/36]
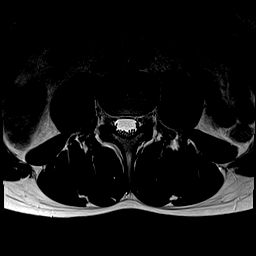
[im 26/36]
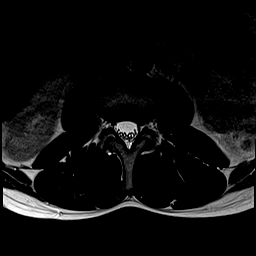
[im 31/36]
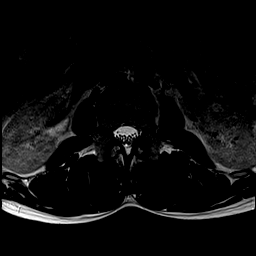
[im 36/36]
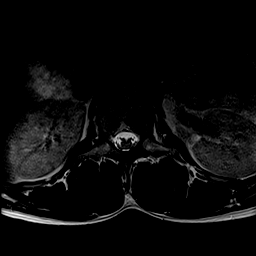

[Series 9: T1 · axial · 4.0mm · 0.37mm/px · z∈[-147,+58]mm · 9 of 36 slices shown (2 of 2)]
[im 1/36]
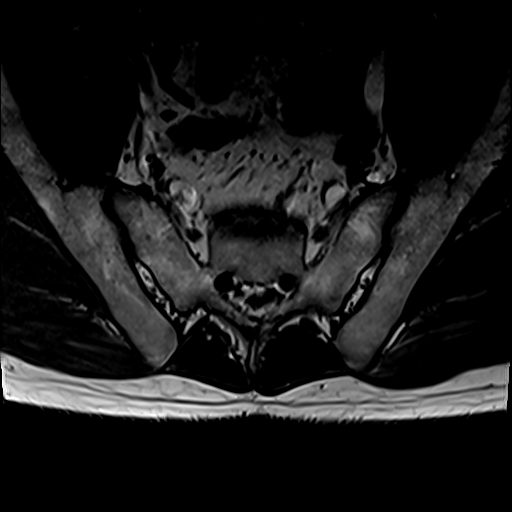
[im 6/36]
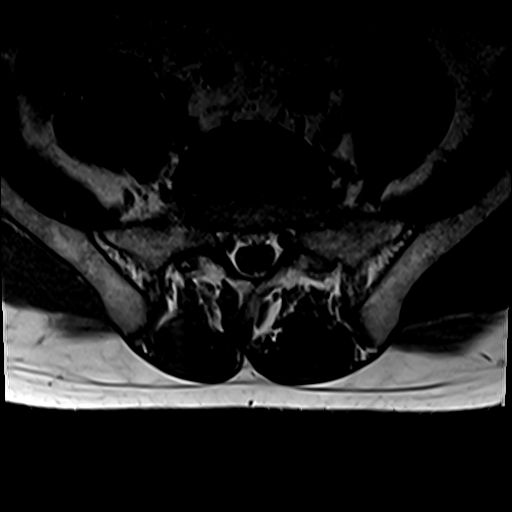
[im 11/36]
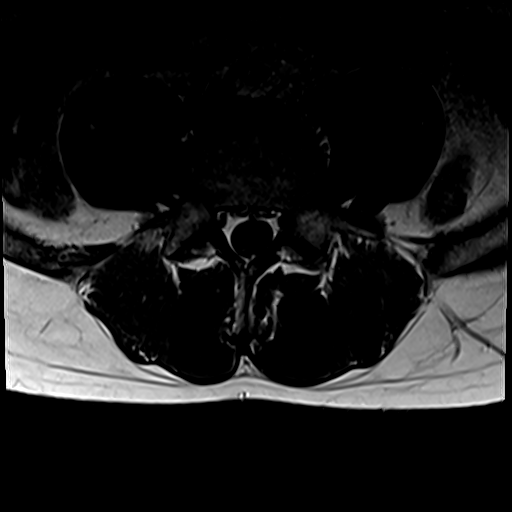
[im 16/36]
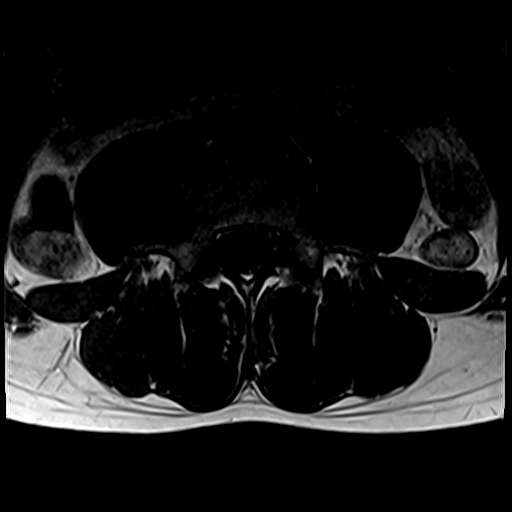
[im 18/36]
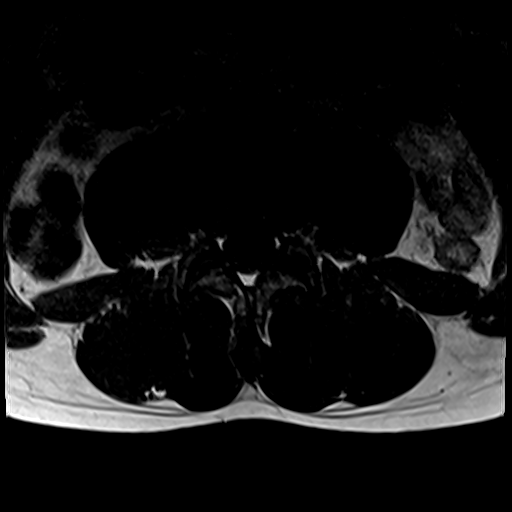
[im 21/36]
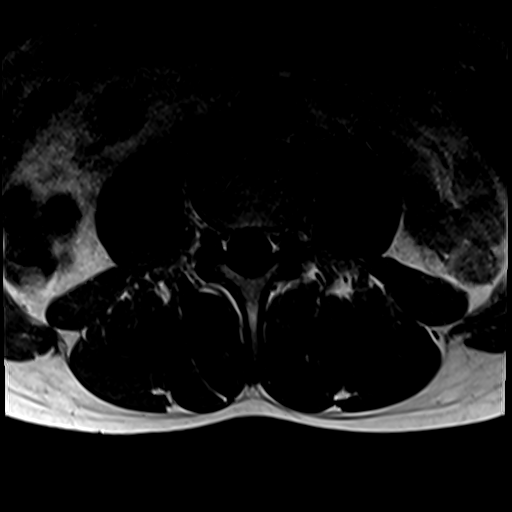
[im 26/36]
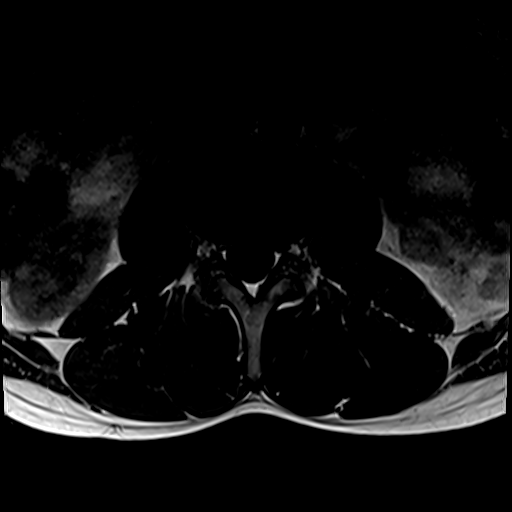
[im 31/36]
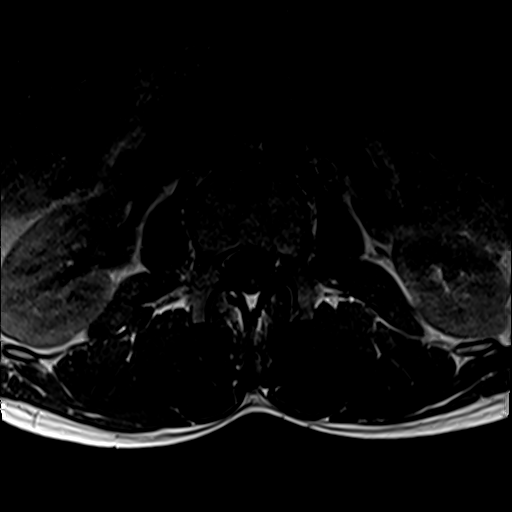
[im 36/36]
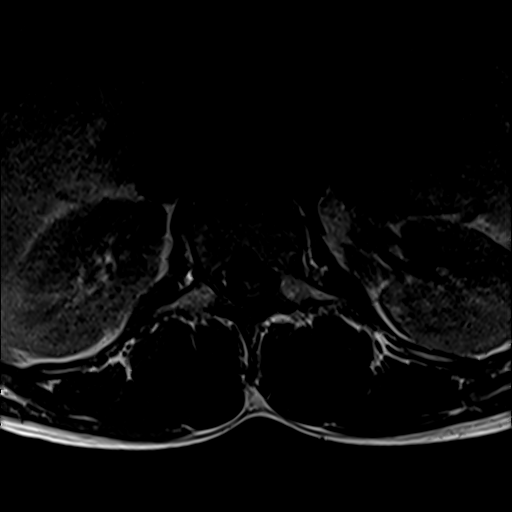

[31 of 48 positions shown; findings below may reference images not displayed]

FINDINGS: Segmentation: Standard; the lowest formed disc space is designated
L5-S1

Alignment:  Normal.

Vertebrae: Vertebral body heights are preserved. Marrow signal is
normal.

Conus medullaris and cauda equina: Conus extends to the L1-L2 level.
Conus and cauda equina appear normal.

Paraspinal and other soft tissues: Unremarkable.

Disc levels:

There is disc desiccation and narrowing at L5-S1. There is a
prominent right subarticular zone protrusion which effaces the
subarticular zone and displaces and likely impinges the traversing
right S1 nerve root. There is no significant spinal canal or neural
foraminal stenosis at this level.

The disc spaces are otherwise preserved. There is no significant
spinal canal or neural foraminal stenosis at the other levels.
IMPRESSION: Right subarticular zone disc protrusion at L5-S1 which displaces and
likely impinges the traversing S1 nerve root, likely accounting for
the patient's symptoms.
